# Patient Record
Sex: Male | Born: 1960 | Race: White | Hispanic: No | Marital: Married | Smoking: Current every day smoker
Health system: Southern US, Community
[De-identification: ages and names within clinical notes are randomized; demographics above are authoritative.]

## PROBLEM LIST (undated history)

## (undated) DIAGNOSIS — Z789 Other specified health status: Secondary | ICD-10-CM

## (undated) HISTORY — PX: NO PAST SURGERIES: SHX2092

---

## 2013-02-26 ENCOUNTER — Ambulatory Visit (HOSPITAL_COMMUNITY)
Admission: RE | Admit: 2013-02-26 | Discharge: 2013-02-26 | Disposition: A | Discharge status: Home / Self Care | Payer: Managed Care, Other (non HMO) | Source: Ambulatory Visit | Attending: Internal Medicine | Admitting: Internal Medicine

## 2013-02-26 ENCOUNTER — Other Ambulatory Visit (HOSPITAL_COMMUNITY): Payer: Self-pay | Admitting: Internal Medicine

## 2013-02-26 DIAGNOSIS — M79609 Pain in unspecified limb: Secondary | ICD-10-CM | POA: Insufficient documentation

## 2013-02-26 DIAGNOSIS — M659 Synovitis and tenosynovitis, unspecified: Secondary | ICD-10-CM

## 2013-03-08 ENCOUNTER — Telehealth: Payer: Self-pay

## 2013-03-08 NOTE — Telephone Encounter (Signed)
Pt was referred by Dr. Fusco for screening colonoscopy. LMOM for a return call.  

## 2013-03-09 NOTE — Telephone Encounter (Signed)
PT wants to wait and schedule colonoscopy in Jan 2015. He is on my call list for mid Dec 2014. ( He is not having any problems and no family hx of colon cancer). Letter to Dr. Sherwood Gambler.

## 2013-06-21 ENCOUNTER — Telehealth: Payer: Self-pay

## 2013-06-21 NOTE — Telephone Encounter (Signed)
Pt referred by Dr. Gerarda Fraction for screening colonoscopy. Had wanted to wait until first of the year. I called and left message for him to call to schedule.

## 2013-06-30 NOTE — Telephone Encounter (Signed)
LMOM to call.

## 2013-07-01 ENCOUNTER — Other Ambulatory Visit: Payer: Self-pay

## 2013-07-01 ENCOUNTER — Telehealth: Payer: Self-pay

## 2013-07-01 DIAGNOSIS — Z1211 Encounter for screening for malignant neoplasm of colon: Secondary | ICD-10-CM

## 2013-07-01 MED ORDER — PEG-KCL-NACL-NASULF-NA ASC-C 100 G PO SOLR: 1.0000 | ORAL | Status: DC

## 2013-07-01 NOTE — Telephone Encounter (Signed)
Ok to schedule.

## 2013-07-01 NOTE — Telephone Encounter (Signed)
Gastroenterology Pre-Procedure Review  Request Date: 07/01/2013 Requesting Physician: Dr. Gerarda Fraction  PATIENT REVIEW QUESTIONS: The patient responded to the following health history questions as indicated:    1. Diabetes Melitis: no 2. Joint replacements in the past 12 months: no 3. Major health problems in the past 3 months: no 4. Has an artificial valve or MVP: no 5. Has a defibrillator: no 6. Has been advised in past to take antibiotics in advance of a procedure like teeth cleaning: no    MEDICATIONS & ALLERGIES:    Patient reports the following regarding taking any blood thinners:   Plavix? no Aspirin? no Coumadin? no  Patient confirms/reports the following medications:  Current Outpatient Prescriptions  Medication Sig Dispense Refill  . ibuprofen (ADVIL,MOTRIN) 200 MG tablet Take 200 mg by mouth every 6 (six) hours as needed. Only occasionally       No current facility-administered medications for this visit.    Patient confirms/reports the following allergies:  No Known Allergies  No orders of the defined types were placed in this encounter.    AUTHORIZATION INFORMATION Primary Insurance:   ID #:   Group #:  Pre-Cert / Auth required:  Pre-Cert / Auth #:   Secondary Insurance:   ID #:   Group #:  Pre-Cert / Auth required: Pre-Cert / Auth #:   SCHEDULE INFORMATION: Procedure has been scheduled as follows:  Date:07/26/2013               Time: 8:30 AM Location: [facility] Short Stay  This Gastroenterology Pre-Precedure Review Form is being routed to the following provider(s): R. Garfield Cornea, MD

## 2013-07-01 NOTE — Telephone Encounter (Signed)
I called [employer] at [phone] and got the automation. PA is not required for screening colonoscopy.

## 2013-07-01 NOTE — Telephone Encounter (Signed)
Rx sent to pharmacy and instructions mailed to pt.

## 2013-07-16 ENCOUNTER — Encounter (HOSPITAL_COMMUNITY): Payer: Self-pay | Admitting: Pharmacy Technician

## 2013-07-26 ENCOUNTER — Encounter (HOSPITAL_COMMUNITY): Payer: Self-pay | Admitting: *Deleted

## 2013-07-26 ENCOUNTER — Encounter (HOSPITAL_COMMUNITY)
Admission: RE | Disposition: A | Discharge status: Home / Self Care | Payer: Self-pay | Source: Ambulatory Visit | Attending: Internal Medicine

## 2013-07-26 ENCOUNTER — Ambulatory Visit (HOSPITAL_COMMUNITY)
Admission: RE | Admit: 2013-07-26 | Discharge: 2013-07-26 | Disposition: A | Discharge status: Home / Self Care | Payer: Managed Care, Other (non HMO) | Source: Ambulatory Visit | Attending: Internal Medicine | Admitting: Internal Medicine

## 2013-07-26 DIAGNOSIS — Z1211 Encounter for screening for malignant neoplasm of colon: Secondary | ICD-10-CM | POA: Insufficient documentation

## 2013-07-26 DIAGNOSIS — D126 Benign neoplasm of colon, unspecified: Secondary | ICD-10-CM | POA: Insufficient documentation

## 2013-07-26 DIAGNOSIS — K5730 Diverticulosis of large intestine without perforation or abscess without bleeding: Secondary | ICD-10-CM | POA: Insufficient documentation

## 2013-07-26 HISTORY — DX: Other specified health status: Z78.9

## 2013-07-26 HISTORY — PX: COLONOSCOPY: SHX5424

## 2013-07-26 SURGERY — COLONOSCOPY
Anesthesia: Moderate Sedation

## 2013-07-26 MED ORDER — MEPERIDINE HCL 100 MG/ML IJ SOLN
INTRAMUSCULAR | Status: DC | PRN
  Administered 2013-07-26: 50 mg via INTRAVENOUS
  Administered 2013-07-26: 25 mg via INTRAVENOUS
  Administered 2013-07-26: 50 mg via INTRAVENOUS

## 2013-07-26 MED ORDER — SODIUM CHLORIDE 0.9 % IV SOLN
INTRAVENOUS | Status: DC
Start: 2013-07-26 — End: 2013-07-26
  Administered 2013-07-26: 08:00:00 via INTRAVENOUS

## 2013-07-26 MED ORDER — STERILE WATER FOR IRRIGATION IR SOLN
Status: DC | PRN
  Administered 2013-07-26: 08:00:00

## 2013-07-26 MED ORDER — ONDANSETRON HCL 4 MG/2ML IJ SOLN
INTRAMUSCULAR | Status: AC
  Filled 2013-07-26: qty 2

## 2013-07-26 MED ORDER — MIDAZOLAM HCL 5 MG/5ML IJ SOLN
INTRAMUSCULAR | Status: AC
  Filled 2013-07-26: qty 10

## 2013-07-26 MED ORDER — MIDAZOLAM HCL 5 MG/5ML IJ SOLN
INTRAMUSCULAR | Status: DC | PRN
  Administered 2013-07-26: 2 mg via INTRAVENOUS
  Administered 2013-07-26: 1 mg via INTRAVENOUS
  Administered 2013-07-26: 2 mg via INTRAVENOUS
  Administered 2013-07-26: 1 mg via INTRAVENOUS

## 2013-07-26 MED ORDER — MEPERIDINE HCL 100 MG/ML IJ SOLN
INTRAMUSCULAR | Status: AC
  Filled 2013-07-26: qty 2

## 2013-07-26 NOTE — Discharge Instructions (Addendum)
Colonoscopy Discharge Instructions  Read the instructions outlined below and refer to this sheet in the next few weeks. These discharge instructions provide you with general information on caring for yourself after you leave the hospital. Your doctor may also give you specific instructions. While your treatment has been planned according to the most current medical practices available, unavoidable complications occasionally occur. If you have any problems or questions after discharge, call Dr. Gala Romney at [phone]. ACTIVITY  You may resume your regular activity, but move at a slower pace for the next 24 hours.   Take frequent rest periods for the next 24 hours.   Walking will help get rid of the air and reduce the bloated feeling in your belly (abdomen).   No driving for 24 hours (because of the medicine (anesthesia) used during the test).    Do not sign any important legal documents or operate any machinery for 24 hours (because of the anesthesia used during the test).  NUTRITION  Drink plenty of fluids.   You may resume your normal diet as instructed by your doctor.   Begin with a light meal and progress to your normal diet. Heavy or fried foods are harder to digest and may make you feel sick to your stomach (nauseated).   Avoid alcoholic beverages for 24 hours or as instructed.  MEDICATIONS  You may resume your normal medications unless your doctor tells you otherwise.  WHAT YOU CAN EXPECT TODAY  Some feelings of bloating in the abdomen.   Passage of more gas than usual.   Spotting of blood in your stool or on the toilet paper.  IF YOU HAD POLYPS REMOVED DURING THE COLONOSCOPY:  No aspirin products for 7 days or as instructed.   No alcohol for 7 days or as instructed.   Eat a soft diet for the next 24 hours.  FINDING OUT THE RESULTS OF YOUR TEST Not all test results are available during your visit. If your test results are not back during the visit, make an appointment  with your caregiver to find out the results. Do not assume everything is normal if you have not heard from your caregiver or the medical facility. It is important for you to follow up on all of your test results.  SEEK IMMEDIATE MEDICAL ATTENTION IF:  You have more than a spotting of blood in your stool.   Your belly is swollen (abdominal distention).   You are nauseated or vomiting.   You have a temperature over 101.   You have abdominal pain or discomfort that is severe or gets worse throughout the day.    Diverticulosis and polyp information provided  Further recommendations to follow Diverticulosis Diverticulosis is a common condition that develops when small pouches (diverticula) form in the wall of the colon. The risk of diverticulosis increases with age. It happens more often in people who eat a low-fiber diet. Most individuals with diverticulosis have no symptoms. Those individuals with symptoms usually experience abdominal pain, constipation, or loose stools (diarrhea). HOME CARE INSTRUCTIONS   Increase the amount of fiber in your diet as directed by your caregiver or dietician. This may reduce symptoms of diverticulosis.  Your caregiver may recommend taking a dietary fiber supplement.  Drink at least 6 to 8 glasses of water each day to prevent constipation.  Try not to strain when you have a bowel movement.  Your caregiver may recommend avoiding nuts and seeds to prevent complications, although this is still an uncertain benefit.  Only  take over-the-counter or prescription medicines for pain, discomfort, or fever as directed by your caregiver. FOODS WITH HIGH FIBER CONTENT INCLUDE:  Fruits. Apple, peach, pear, tangerine, raisins, prunes.  Vegetables. Brussels sprouts, asparagus, broccoli, cabbage, carrot, cauliflower, romaine lettuce, spinach, summer squash, tomato, winter squash, zucchini.  Starchy Vegetables. Baked beans, kidney beans, lima beans, split peas,  lentils, potatoes (with skin).  Grains. Whole wheat bread, brown rice, bran flake cereal, plain oatmeal, white rice, shredded wheat, bran muffins. SEEK IMMEDIATE MEDICAL CARE IF:   You develop increasing pain or severe bloating.  You have an oral temperature above 102 F (38.9 C), not controlled by medicine.  You develop vomiting or bowel movements that are bloody or black. Document Released: 02/29/2004 Document Revised: 08/26/2011 Document Reviewed: 11/01/2009 [facility] Patient Information 2014 [place]. Colon Polyps Polyps are lumps of extra tissue growing inside the body. Polyps can grow in the large intestine (colon). Most colon polyps are noncancerous (benign). However, some colon polyps can become cancerous over time. Polyps that are larger than a pea may be harmful. To be safe, caregivers remove and test all polyps. CAUSES  Polyps form when mutations in the genes cause your cells to grow and divide even though no more tissue is needed. RISK FACTORS There are a number of risk factors that can increase your chances of getting colon polyps. They include:  Being older than 50 years.  Family history of colon polyps or colon cancer.  Long-term colon diseases, such as colitis or Crohn disease.  Being overweight.  Smoking.  Being inactive.  Drinking too much alcohol. SYMPTOMS  Most small polyps do not cause symptoms. If symptoms are present, they may include:  Blood in the stool. The stool may look dark red or black.  Constipation or diarrhea that lasts longer than 1 week. DIAGNOSIS People often do not know they have polyps until their caregiver finds them during a regular checkup. Your caregiver can use 4 tests to check for polyps:  Digital rectal exam. The caregiver wears gloves and feels inside the rectum. This test would find polyps only in the rectum.  Barium enema. The caregiver puts a liquid called barium into your rectum before taking X-rays of your colon.  Barium makes your colon look white. Polyps are dark, so they are easy to see in the X-ray pictures.  Sigmoidoscopy. A thin, flexible tube (sigmoidoscope) is placed into your rectum. The sigmoidoscope has a light and tiny camera in it. The caregiver uses the sigmoidoscope to look at the last third of your colon.  Colonoscopy. This test is like sigmoidoscopy, but the caregiver looks at the entire colon. This is the most common method for finding and removing polyps. TREATMENT  Any polyps will be removed during a sigmoidoscopy or colonoscopy. The polyps are then tested for cancer. PREVENTION  To help lower your risk of getting more colon polyps:  Eat plenty of fruits and vegetables. Avoid eating fatty foods.  Do not smoke.  Avoid drinking alcohol.  Exercise every day.  Lose weight if recommended by your caregiver.  Eat plenty of calcium and folate. Foods that are rich in calcium include milk, cheese, and broccoli. Foods that are rich in folate include chickpeas, kidney beans, and spinach. HOME CARE INSTRUCTIONS Keep all follow-up appointments as directed by your caregiver. You may need periodic exams to check for polyps. SEEK MEDICAL CARE IF: You notice bleeding during a bowel movement. Document Released: 02/28/2004 Document Revised: 08/26/2011 Document Reviewed: 08/13/2011 [facility] Patient Information 2014 [place].

## 2013-07-26 NOTE — H&P (Signed)
  Primary Care Physician:  Glo Herring., MD Primary Gastroenterologist:  Dr. Gala Romney  Pre-Procedure History & Physical: HPI:  Antonio Valenzuela is a 53 y.o. male is here for a screening colonoscopy. No bowel symptoms. No prior colonoscopy. No family history colon polyps or colon cancer.  Past Medical History  Diagnosis Date  . Medical history non-contributory     Past Surgical History  Procedure Laterality Date  . No past surgeries      Prior to Admission medications   Medication Sig Start Date End Date Taking? Authorizing Provider  peg 3350 powder (MOVIPREP) 100 G SOLR Take 1 kit (200 g total) by mouth as directed. 07/01/13   Daneil Dolin, MD    Allergies as of 07/01/2013  . (No Known Allergies)    Family History  Problem Relation Age of Onset  . Colon cancer Neg Hx     History   Social History  . Marital Status: Married    Spouse Name: N/A    Number of Children: N/A  . Years of Education: N/A   Occupational History  . Not on file.   Social History Main Topics  . Smoking status: Current Every Day Smoker -- 0.50 packs/day for 20 years    Types: Cigarettes  . Smokeless tobacco: Not on file  . Alcohol Use: Yes     Comment: "very little" per pt   . Drug Use: No  . Sexual Activity: Not on file   Other Topics Concern  . Not on file   Social History Narrative  . No narrative on file    Review of Systems: See HPI, otherwise negative ROS  Physical Exam: BP 145/94  Pulse 66  Temp(Src) 97.5 F (36.4 C) (Oral)  Resp 21  Ht _0  (1.778 m)  Wt 220 lb (99.791 kg)  BMI 31.57 kg/m2  SpO2 97% General:   Alert,  Well-developed, well-nourished, pleasant and cooperative in NAD Head:  Normocephalic and atraumatic. Eyes:  Sclera clear, no icterus.   Conjunctiva pink. Ears:  Normal auditory acuity. Nose:  No deformity, discharge,  or lesions. Mouth:  No deformity or lesions, dentition normal. Neck:  Supple; no masses or thyromegaly. Lungs:  Clear throughout to  auscultation.   No wheezes, crackles, or rhonchi. No acute distress. Heart:  Regular rate and rhythm; no murmurs, clicks, rubs,  or gallops. Abdomen:  Soft, nontender and nondistended. No masses, hepatosplenomegaly or hernias noted. Normal bowel sounds, without guarding, and without rebound.   Msk:  Symmetrical without gross deformities. Normal posture. Pulses:  Normal pulses noted. Extremities:  Without clubbing or edema. Neurologic:  Alert and  oriented x4;  grossly normal neurologically. Skin:  Intact without significant lesions or rashes. Cervical Nodes:  No significant cervical adenopathy. Psych:  Alert and cooperative. Normal mood and affect.  Impression/Plan: Antonio Valenzuela is now here to undergo a screening colonoscopy.   First-ever average risk screening examination.  Risks, benefits, limitations, imponderables and alternatives regarding colonoscopy have been reviewed with the patient. Questions have been answered. All parties agreeable.

## 2013-07-26 NOTE — Op Note (Signed)
[facility] [address] [place], [address]   COLONOSCOPY PROCEDURE REPORT  PATIENT: Antonio Valenzuela, Antonio Valenzuela  MR#:         [id number] BIRTHDATE: 1961-01-22 , 52  yrs. old GENDER: Male ENDOSCOPIST: R.  Garfield Cornea, MD FACP FACG REFERRED BY:  Kerin Perna, M.D. PROCEDURE DATE:  07/26/2013 PROCEDURE:     Ileocolonoscopy with snare polypectomy  INDICATIONS: First-ever average risk colorectal cancer screening examination  INFORMED CONSENT:  The risks, benefits, alternatives and imponderables including but not limited to bleeding, perforation as well as the possibility of a missed lesion have been reviewed.  The potential for biopsy, lesion removal, etc. have also been discussed.  Questions have been answered.  All parties agreeable. Please see the history and physical in the medical record for more information.  MEDICATIONS: Versed 6 mg IV and Demerol 125 mg IV in divided doses. Zofran 4 mg IV .  DESCRIPTION OF PROCEDURE:  After a digital rectal exam was performed, the EC-3890Li ([id number])  colonoscope was advanced from the anus through the rectum and colon to the area of the cecum, ileocecal valve and appendiceal orifice.  The cecum was deeply intubated.  These structures were well-seen and photographed for the record.  From the level of the cecum and ileocecal valve, the scope was slowly and cautiously withdrawn.  The mucosal surfaces were carefully surveyed utilizing scope tip deflection to facilitate fold flattening as needed.  The scope was pulled down into the rectum where a thorough examination including retroflexion was performed.    FINDINGS:  Adequate preparation. Normal rectum. Scattered left-sided diverticula; (1) 6 mm polyp in the mid sigmoid segment; otherwise, the remainder of the colonic mucosa appeared normal. The distal 5 cm of terminal ileal mucosa also appeared normal  THERAPEUTIC / DIAGNOSTIC MANEUVERS PERFORMED:  The above-mentioned polyp was  hot snare removed  COMPLICATIONS: None  CECAL WITHDRAWAL TIME:  9 minutes  IMPRESSION:  Colonic diverticulosis. Colonic polyp-removed as described above  RECOMMENDATIONS: Followup on pathology.   _______________________________ eSigned:  R. Garfield Cornea, MD FACP [facility] 07/26/2013 9:20 AM   CC:

## 2013-07-28 ENCOUNTER — Encounter (HOSPITAL_COMMUNITY): Payer: Self-pay | Admitting: Internal Medicine

## 2013-07-28 ENCOUNTER — Encounter: Payer: Self-pay | Admitting: Internal Medicine

## 2014-05-25 ENCOUNTER — Other Ambulatory Visit (HOSPITAL_COMMUNITY): Payer: Self-pay | Admitting: Physician Assistant

## 2014-05-25 ENCOUNTER — Ambulatory Visit (HOSPITAL_COMMUNITY)
Admission: RE | Admit: 2014-05-25 | Discharge: 2014-05-25 | Disposition: A | Discharge status: Home / Self Care | Payer: Managed Care, Other (non HMO) | Source: Ambulatory Visit | Attending: Physician Assistant | Admitting: Physician Assistant

## 2014-05-25 DIAGNOSIS — M25552 Pain in left hip: Secondary | ICD-10-CM

## 2015-01-05 ENCOUNTER — Emergency Department (INDEPENDENT_AMBULATORY_CARE_PROVIDER_SITE_OTHER): Payer: Managed Care, Other (non HMO)

## 2015-01-05 ENCOUNTER — Encounter (HOSPITAL_COMMUNITY): Payer: Self-pay | Admitting: Emergency Medicine

## 2015-01-05 ENCOUNTER — Emergency Department (HOSPITAL_COMMUNITY)
Admission: EM | Admit: 2015-01-05 | Discharge: 2015-01-05 | Disposition: A | Discharge status: Home / Self Care | Payer: Managed Care, Other (non HMO) | Source: Home / Self Care | Attending: Family Medicine | Admitting: Family Medicine

## 2015-01-05 DIAGNOSIS — S93402A Sprain of unspecified ligament of left ankle, initial encounter: Secondary | ICD-10-CM | POA: Diagnosis not present

## 2015-01-05 NOTE — Discharge Instructions (Signed)

## 2015-01-05 NOTE — ED Provider Notes (Signed)
CSN: [id number]     Arrival date & time 01/05/15  1302 History   First MD Initiated Contact with Patient 01/05/15 1340     Chief Complaint  Patient presents with  . Ankle Injury   (Consider location/radiation/quality/duration/timing/severity/associated sxs/prior Treatment) HPI Comments: 54 year old male states that he stepped in a hole yesterday afternoon with his right foot and "rolled" the ankle. He continued to walk on it and later apply ice and keep it elevated. Today there is swelling primarily to the lateral aspect of the ankle as well as pain with weightbearing.  Patient is a 54 y.o. male presenting with lower extremity injury.  Ankle Injury Pertinent negatives include no headaches.    Past Medical History  Diagnosis Date  . Medical history non-contributory    Past Surgical History  Procedure Laterality Date  . No past surgeries    . Colonoscopy N/A 07/26/2013    Procedure: COLONOSCOPY;  Surgeon: Daneil Dolin, MD;  Location: [facility];  Service: Endoscopy;  Laterality: N/A;  8:30 AM   Family History  Problem Relation Age of Onset  . Colon cancer Neg Hx    History  Substance Use Topics  . Smoking status: Current Every Day Smoker -- 0.50 packs/day for 20 years    Types: Cigarettes  . Smokeless tobacco: Not on file  . Alcohol Use: Yes     Comment: "very little" per pt     Review of Systems  Constitutional: Negative.   Respiratory: Negative.   Musculoskeletal: Positive for joint swelling. Negative for myalgias, back pain and neck pain.       As per HPI  Skin: Negative.   Neurological: Negative for dizziness, weakness, numbness and headaches.    Allergies  Review of patient's allergies indicates no known allergies.  Home Medications   Prior to Admission medications   Not on File   BP 141/91 mmHg  Pulse 80  Temp(Src) 98.5 F (36.9 C) (Oral)  Resp 20  SpO2 97% Physical Exam  Constitutional: He is oriented to person, place, and time. He appears  well-developed and well-nourished.  HENT:  Head: Normocephalic and atraumatic.  Eyes: EOM are normal. Left eye exhibits no discharge.  Neck: Normal range of motion. Neck supple.  Pulmonary/Chest: Effort normal. No respiratory distress.  Musculoskeletal: He exhibits edema and tenderness.  Mild swelling to the lateral aspect of the right ankle. Positive for local tenderness to the lateral malleolus and surrounding soft tissue. No deformity. Minor pallor and ecchymosis to the ankle. Plantarflexion and dorsiflexion is completely intact. Inversion and inversion with partial range of motion due to pain limitations. No tenderness to the foot. No deformity. No swelling to the foot. Pedal pulses 2+. Distal neurovascular motor sensory is intact.   Neurological: He is alert and oriented to person, place, and time. No cranial nerve deficit.  Skin: Skin is warm and dry.  Psychiatric: He has a normal mood and affect.  Nursing note and vitals reviewed.   ED Course  Procedures (including critical care time) Labs Review Labs Reviewed - No data to display  Imaging Review Dg Ankle Complete Right  01/05/2015   CLINICAL DATA:  Stepped in hole yesterday with inversion injury, initial encounter  EXAM: RIGHT ANKLE - COMPLETE 3+ VIEW  COMPARISON:  02/26/2013  FINDINGS: Considerable soft tissue swelling is noted particularly laterally. No acute fracture or dislocation is noted. Well corticated bony densities are noted adjacent to the talus dorsally as well as the base of the fifth metatarsal which  may be related to prior trauma. The density adjacent to the talus is stable from the prior exam.  IMPRESSION: No acute abnormality noted.   Electronically Signed   By: Inez Catalina M.D.   On: 01/05/2015 14:04     MDM   1. Left ankle sprain, initial encounter    ASO RICE Crutches with partial wt bearing as tolerated    Janne Napoleon, NP 01/05/15 1427

## 2015-01-05 NOTE — ED Notes (Signed)
Pt declined crutches... Reports he has some at home... Notified Tiffany L, Rn

## 2015-01-05 NOTE — ED Notes (Signed)
Pt states that he was in his yard yesterday and walked into a hole and rolled his right ankle

## 2016-04-19 IMAGING — DX DG ANKLE COMPLETE 3+V*R*
3 series · 3 of 3 positions shown · non-contrast
Comparison: 02/26/2013

CLINICAL DATA: Stepped in hole yesterday with inversion injury,
initial encounter

EXAM:
RIGHT ANKLE - COMPLETE 3+ VIEW

[ankle ap]
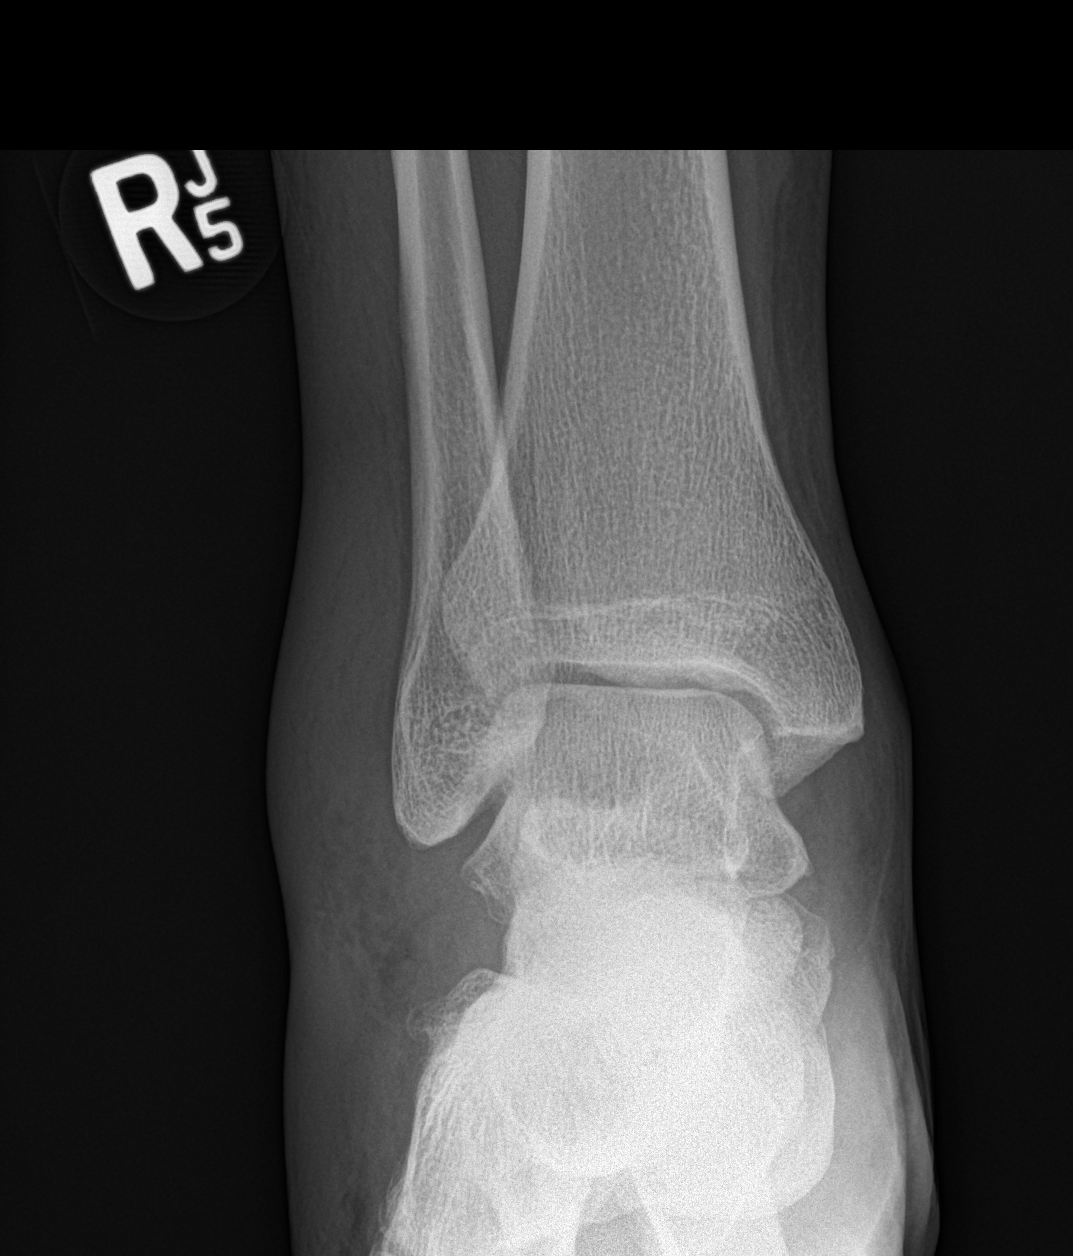

[ankle obl]
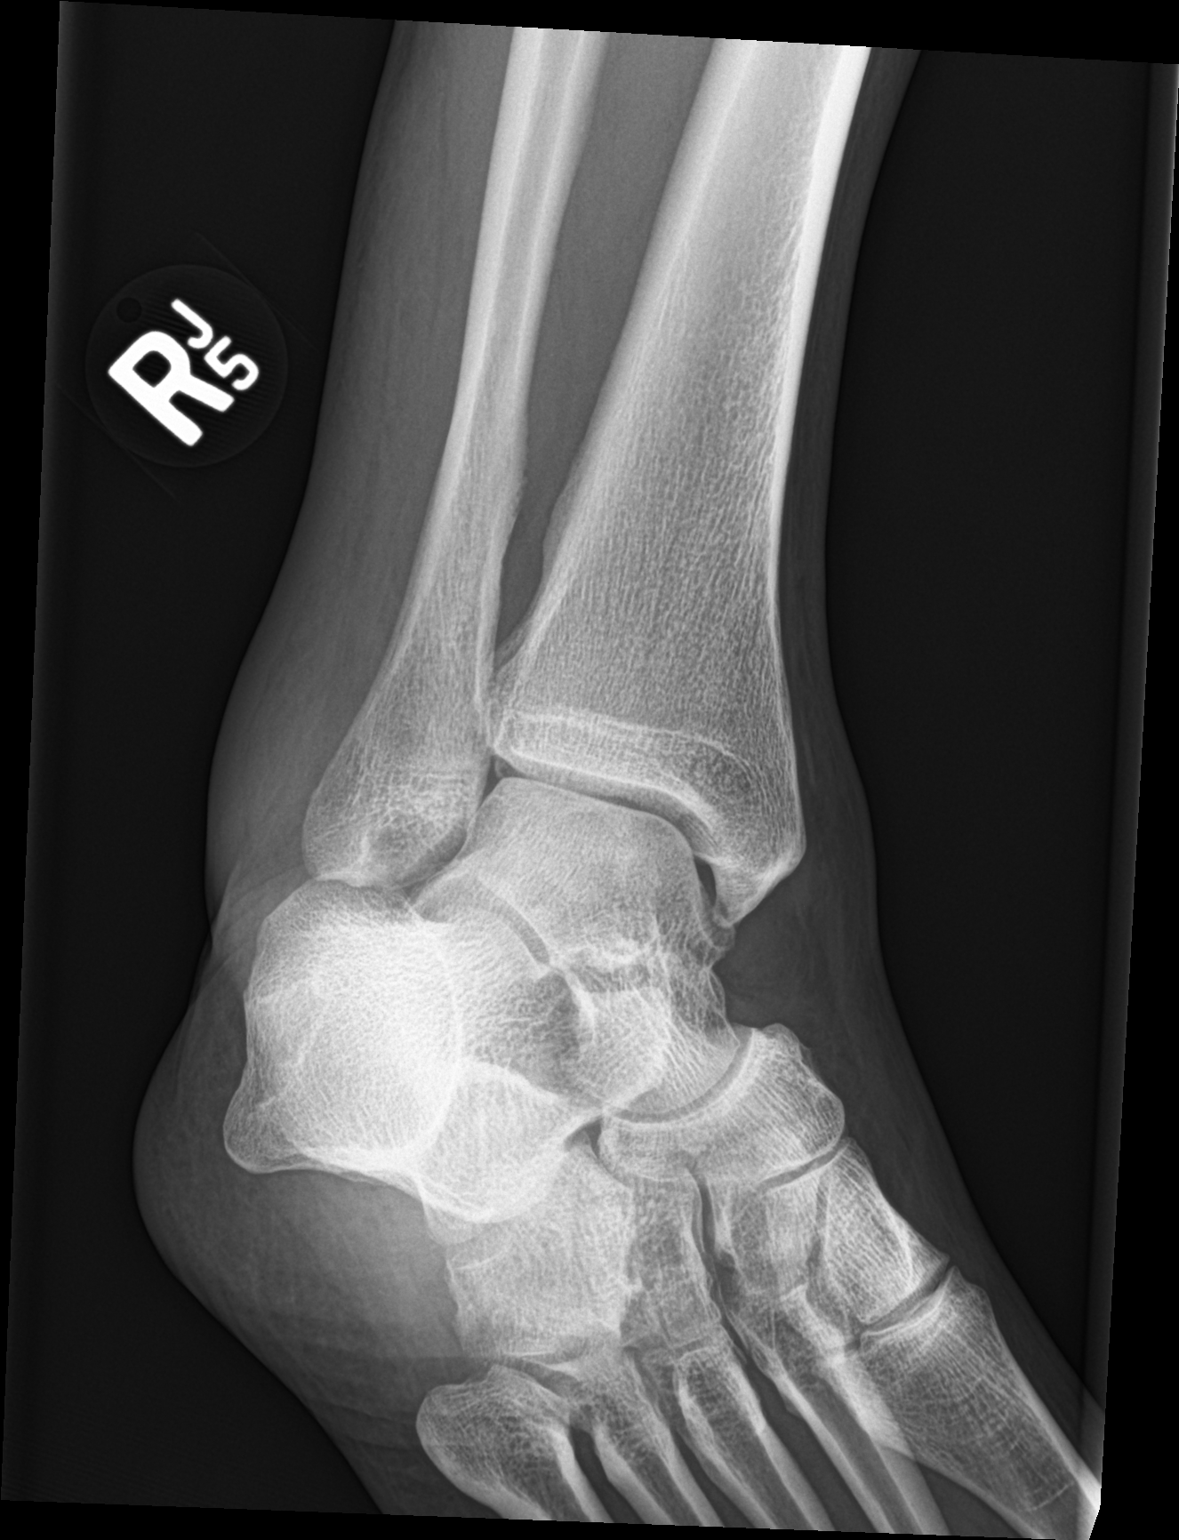

[ankle lat]
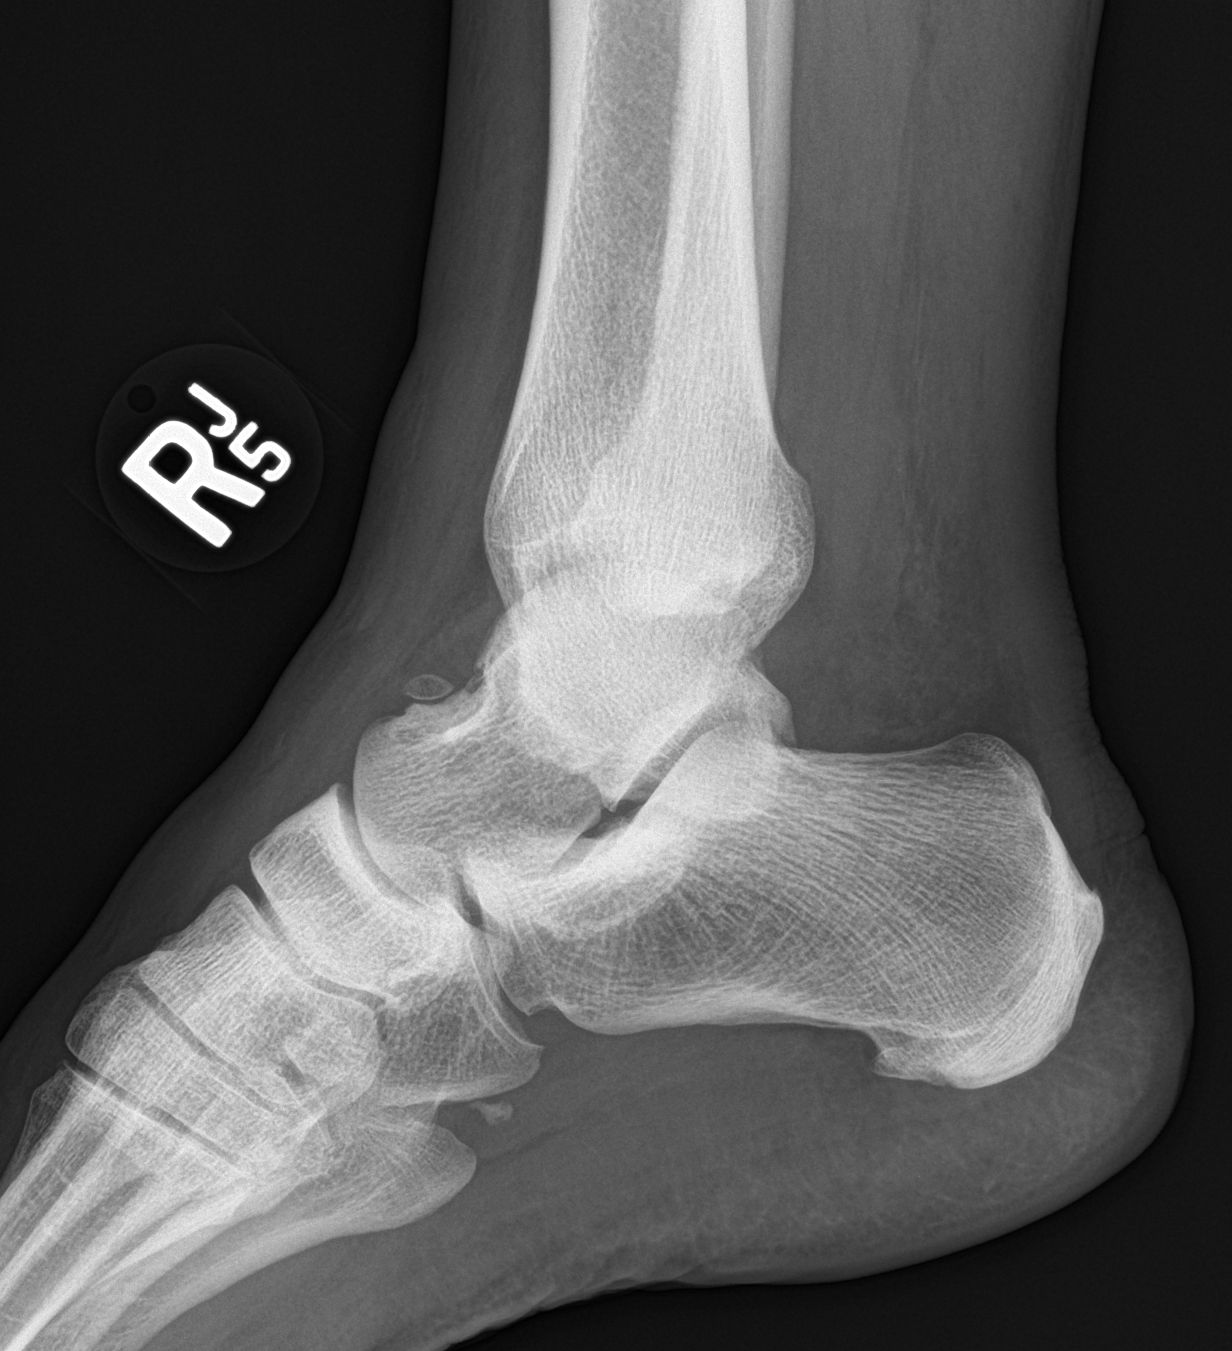

[3 of 3 positions shown; findings below may reference images not displayed]

FINDINGS: Considerable soft tissue swelling is noted particularly laterally.
No acute fracture or dislocation is noted. Well corticated bony
densities are noted adjacent to the talus dorsally as well as the
base of the fifth metatarsal which may be related to prior trauma.
The density adjacent to the talus is stable from the prior exam.
IMPRESSION: No acute abnormality noted.

## 2016-07-18 DIAGNOSIS — F17290 Nicotine dependence, other tobacco product, uncomplicated: Secondary | ICD-10-CM | POA: Diagnosis not present

## 2016-07-18 DIAGNOSIS — R945 Abnormal results of liver function studies: Secondary | ICD-10-CM | POA: Diagnosis not present

## 2016-07-18 DIAGNOSIS — R5383 Other fatigue: Secondary | ICD-10-CM | POA: Diagnosis not present

## 2016-07-18 DIAGNOSIS — Z0001 Encounter for general adult medical examination with abnormal findings: Secondary | ICD-10-CM | POA: Diagnosis not present

## 2016-07-18 DIAGNOSIS — Z1389 Encounter for screening for other disorder: Secondary | ICD-10-CM | POA: Diagnosis not present

## 2016-07-18 DIAGNOSIS — E6609 Other obesity due to excess calories: Secondary | ICD-10-CM | POA: Diagnosis not present

## 2016-07-18 DIAGNOSIS — Z6832 Body mass index (BMI) 32.0-32.9, adult: Secondary | ICD-10-CM | POA: Diagnosis not present

## 2016-07-19 ENCOUNTER — Other Ambulatory Visit (HOSPITAL_COMMUNITY): Payer: Self-pay | Admitting: Internal Medicine

## 2016-07-19 DIAGNOSIS — F17290 Nicotine dependence, other tobacco product, uncomplicated: Secondary | ICD-10-CM

## 2016-07-25 DIAGNOSIS — Z87891 Personal history of nicotine dependence: Secondary | ICD-10-CM | POA: Diagnosis not present

## 2016-07-25 DIAGNOSIS — R05 Cough: Secondary | ICD-10-CM | POA: Diagnosis not present

## 2016-07-30 DIAGNOSIS — Z6832 Body mass index (BMI) 32.0-32.9, adult: Secondary | ICD-10-CM | POA: Diagnosis not present

## 2016-07-30 DIAGNOSIS — R5383 Other fatigue: Secondary | ICD-10-CM | POA: Diagnosis not present

## 2016-08-05 DIAGNOSIS — N281 Cyst of kidney, acquired: Secondary | ICD-10-CM | POA: Diagnosis not present

## 2016-08-29 DIAGNOSIS — G4733 Obstructive sleep apnea (adult) (pediatric): Secondary | ICD-10-CM | POA: Diagnosis not present

## 2016-09-05 DIAGNOSIS — G4733 Obstructive sleep apnea (adult) (pediatric): Secondary | ICD-10-CM | POA: Diagnosis not present

## 2016-10-06 DIAGNOSIS — G4733 Obstructive sleep apnea (adult) (pediatric): Secondary | ICD-10-CM | POA: Diagnosis not present

## 2016-11-05 DIAGNOSIS — G4733 Obstructive sleep apnea (adult) (pediatric): Secondary | ICD-10-CM | POA: Diagnosis not present

## 2016-12-06 DIAGNOSIS — G4733 Obstructive sleep apnea (adult) (pediatric): Secondary | ICD-10-CM | POA: Diagnosis not present

## 2017-01-05 DIAGNOSIS — G4733 Obstructive sleep apnea (adult) (pediatric): Secondary | ICD-10-CM | POA: Diagnosis not present

## 2017-02-05 DIAGNOSIS — G4733 Obstructive sleep apnea (adult) (pediatric): Secondary | ICD-10-CM | POA: Diagnosis not present

## 2017-03-08 DIAGNOSIS — G4733 Obstructive sleep apnea (adult) (pediatric): Secondary | ICD-10-CM | POA: Diagnosis not present

## 2017-04-07 DIAGNOSIS — G4733 Obstructive sleep apnea (adult) (pediatric): Secondary | ICD-10-CM | POA: Diagnosis not present

## 2017-05-08 DIAGNOSIS — G4733 Obstructive sleep apnea (adult) (pediatric): Secondary | ICD-10-CM | POA: Diagnosis not present

## 2017-06-07 DIAGNOSIS — G4733 Obstructive sleep apnea (adult) (pediatric): Secondary | ICD-10-CM | POA: Diagnosis not present

## 2017-07-08 DIAGNOSIS — G4733 Obstructive sleep apnea (adult) (pediatric): Secondary | ICD-10-CM | POA: Diagnosis not present

## 2017-07-16 DIAGNOSIS — Z6834 Body mass index (BMI) 34.0-34.9, adult: Secondary | ICD-10-CM | POA: Diagnosis not present

## 2017-07-16 DIAGNOSIS — B349 Viral infection, unspecified: Secondary | ICD-10-CM | POA: Diagnosis not present

## 2017-07-16 DIAGNOSIS — R509 Fever, unspecified: Secondary | ICD-10-CM | POA: Diagnosis not present

## 2017-07-16 DIAGNOSIS — R51 Headache: Secondary | ICD-10-CM | POA: Diagnosis not present

## 2017-07-16 DIAGNOSIS — R05 Cough: Secondary | ICD-10-CM | POA: Diagnosis not present

## 2017-07-16 DIAGNOSIS — Z1389 Encounter for screening for other disorder: Secondary | ICD-10-CM | POA: Diagnosis not present

## 2017-08-08 DIAGNOSIS — G4733 Obstructive sleep apnea (adult) (pediatric): Secondary | ICD-10-CM | POA: Diagnosis not present

## 2017-08-14 DIAGNOSIS — E6609 Other obesity due to excess calories: Secondary | ICD-10-CM | POA: Diagnosis not present

## 2017-08-14 DIAGNOSIS — Z6833 Body mass index (BMI) 33.0-33.9, adult: Secondary | ICD-10-CM | POA: Diagnosis not present

## 2017-08-14 DIAGNOSIS — M25552 Pain in left hip: Secondary | ICD-10-CM | POA: Diagnosis not present

## 2017-09-05 DIAGNOSIS — G4733 Obstructive sleep apnea (adult) (pediatric): Secondary | ICD-10-CM | POA: Diagnosis not present

## 2017-10-06 DIAGNOSIS — G4733 Obstructive sleep apnea (adult) (pediatric): Secondary | ICD-10-CM | POA: Diagnosis not present

## 2018-01-14 DIAGNOSIS — M722 Plantar fascial fibromatosis: Secondary | ICD-10-CM | POA: Diagnosis not present

## 2018-01-14 DIAGNOSIS — L851 Acquired keratosis [keratoderma] palmaris et plantaris: Secondary | ICD-10-CM | POA: Diagnosis not present

## 2018-01-14 DIAGNOSIS — M79672 Pain in left foot: Secondary | ICD-10-CM | POA: Diagnosis not present

## 2018-03-03 DIAGNOSIS — Z0001 Encounter for general adult medical examination with abnormal findings: Secondary | ICD-10-CM | POA: Diagnosis not present

## 2018-04-07 DIAGNOSIS — G4733 Obstructive sleep apnea (adult) (pediatric): Secondary | ICD-10-CM | POA: Diagnosis not present

## 2018-05-27 DIAGNOSIS — Z6829 Body mass index (BMI) 29.0-29.9, adult: Secondary | ICD-10-CM | POA: Diagnosis not present

## 2018-05-27 DIAGNOSIS — E663 Overweight: Secondary | ICD-10-CM | POA: Diagnosis not present

## 2018-05-27 DIAGNOSIS — H6993 Unspecified Eustachian tube disorder, bilateral: Secondary | ICD-10-CM | POA: Diagnosis not present

## 2018-05-27 DIAGNOSIS — J019 Acute sinusitis, unspecified: Secondary | ICD-10-CM | POA: Diagnosis not present

## 2018-11-26 DIAGNOSIS — G4733 Obstructive sleep apnea (adult) (pediatric): Secondary | ICD-10-CM | POA: Diagnosis not present

## 2019-06-17 ENCOUNTER — Other Ambulatory Visit: Payer: Self-pay

## 2019-06-17 ENCOUNTER — Ambulatory Visit
Admission: EM | Admit: 2019-06-17 | Discharge: 2019-06-17 | Disposition: A | Discharge status: Home / Self Care | Payer: BC Managed Care – PPO | Attending: Emergency Medicine | Admitting: Emergency Medicine

## 2019-06-17 DIAGNOSIS — Z20828 Contact with and (suspected) exposure to other viral communicable diseases: Secondary | ICD-10-CM | POA: Diagnosis not present

## 2019-06-17 DIAGNOSIS — Z20822 Contact with and (suspected) exposure to covid-19: Secondary | ICD-10-CM

## 2019-06-17 NOTE — ED Provider Notes (Signed)
[place]   [id number]:[id number] 06/17/19 Arrival Time: 63   CC: COVID exposure  SUBJECTIVE: History from: patient.  Antonio Valenzuela is a 58 y.o. male who presents for COVID testing.  COVID exposure 06/11/2019.  Denies recent travel.  Denies aggravating or alleviating symptoms.  Denies previous COVID infection.   Denies fever, chills, fatigue, nasal congestion, rhinorrhea, sore throat, cough, SOB, wheezing, chest pain, nausea, vomiting, changes in bowel or bladder habits.     ROS: As per HPI.  All other pertinent ROS negative.     Past Medical History:  Diagnosis Date  . Medical history non-contributory    Past Surgical History:  Procedure Laterality Date  . COLONOSCOPY N/A 07/26/2013   Procedure: COLONOSCOPY;  Surgeon: Daneil Dolin, MD;  Location: [facility];  Service: Endoscopy;  Laterality: N/A;  8:30 AM  . NO PAST SURGERIES     No Known Allergies No current facility-administered medications on file prior to encounter.   No current outpatient medications on file prior to encounter.   Social History   Socioeconomic History  . Marital status: Married    Spouse name: Not on file  . Number of children: Not on file  . Years of education: Not on file  . Highest education level: Not on file  Occupational History  . Not on file  Tobacco Use  . Smoking status: Current Every Day Smoker    Packs/day: 0.50    Years: 20.00    Pack years: 10.00    Types: Cigarettes  Substance and Sexual Activity  . Alcohol use: Yes    Comment: "very little" per pt   . Drug use: No  . Sexual activity: Not on file  Other Topics Concern  . Not on file  Social History Narrative  . Not on file   Social Determinants of Health   Financial Resource Strain:   . Difficulty of Paying Living Expenses: Not on file  Food Insecurity:   . Worried About Charity fundraiser in the Last Year: Not on file  . Ran Out of Food in the Last Year: Not on file  Transportation Needs:   . Lack of  Transportation (Medical): Not on file  . Lack of Transportation (Non-Medical): Not on file  Physical Activity:   . Days of Exercise per Week: Not on file  . Minutes of Exercise per Session: Not on file  Stress:   . Feeling of Stress : Not on file  Social Connections:   . Frequency of Communication with Friends and Family: Not on file  . Frequency of Social Gatherings with Friends and Family: Not on file  . Attends Religious Services: Not on file  . Active Member of Clubs or Organizations: Not on file  . Attends Archivist Meetings: Not on file  . Marital Status: Not on file  Intimate Partner Violence:   . Fear of Current or Ex-Partner: Not on file  . Emotionally Abused: Not on file  . Physically Abused: Not on file  . Sexually Abused: Not on file   Family History  Problem Relation Age of Onset  . Pulmonary disease Father   . Colon cancer Neg Hx     OBJECTIVE:  Vitals:   06/17/19 1357  BP: 119/83  Pulse: 74  Resp: 18  Temp: 98.5 F (36.9 C)  SpO2: 94%     General appearance: alert; well-appearing nontoxic; speaking in full sentences and tolerating own secretions HEENT: NCAT; Ears: EACs clear, TMs pearly  gray; Eyes: PERRL.  EOM grossly intact. Nose: nares patent without rhinorrhea, Throat: oropharynx clear, tonsils non erythematous or enlarged, uvula midline  Neck: supple without LAD Lungs: unlabored respirations, symmetrical air entry; cough: absent; no respiratory distress; CTAB Heart: regular rate and rhythm.  Skin: warm and dry Psychological: alert and cooperative; normal mood and affect  ASSESSMENT & PLAN:  1. Exposure to COVID-19 virus    COVID testing ordered.  It will take between 5-7 days for test results.  Someone will contact you regarding abnormal results.    In the meantime: You should remain isolated in your home for 10 days from symptom onset AND greater than 72 hours after symptoms resolution (absence of fever without the use of  fever-reducing medication and improvement in respiratory symptoms), whichever is longer OR 14 days from exposure Get plenty of rest and push fluids Use OTC zyrtec for nasal congestion, runny nose, and/or sore throat Use OTC flonase for nasal congestion and runny nose Use medications daily for symptom relief Use OTC medications like ibuprofen or tylenol as needed fever or pain Call or go to the ED if you have any new or worsening symptoms such as fever, cough, shortness of breath, chest tightness, chest pain, turning blue, changes in mental status, etc...   Reviewed expectations re: course of current medical issues. Questions answered. Outlined signs and symptoms indicating need for more acute intervention. Patient verbalized understanding. After Visit Summary given.         Lestine Box, PA-C 06/17/19 1436

## 2019-06-17 NOTE — ED Triage Notes (Signed)
Pt had positive exposure and needs covid test

## 2019-06-17 NOTE — Discharge Instructions (Signed)

## 2019-06-19 LAB — NOVEL CORONAVIRUS, NAA: SARS-CoV-2, NAA: NOT DETECTED

## 2019-06-22 ENCOUNTER — Other Ambulatory Visit: Payer: Self-pay

## 2019-06-22 ENCOUNTER — Ambulatory Visit: Payer: BC Managed Care – PPO | Attending: Internal Medicine

## 2019-06-22 DIAGNOSIS — Z20822 Contact with and (suspected) exposure to covid-19: Secondary | ICD-10-CM

## 2019-06-24 LAB — NOVEL CORONAVIRUS, NAA: SARS-CoV-2, NAA: NOT DETECTED

## 2019-08-16 DIAGNOSIS — G4733 Obstructive sleep apnea (adult) (pediatric): Secondary | ICD-10-CM | POA: Diagnosis not present

## 2019-08-26 DIAGNOSIS — Z23 Encounter for immunization: Secondary | ICD-10-CM | POA: Diagnosis not present

## 2019-09-25 DIAGNOSIS — Z23 Encounter for immunization: Secondary | ICD-10-CM | POA: Diagnosis not present

## 2019-10-05 DIAGNOSIS — Z1389 Encounter for screening for other disorder: Secondary | ICD-10-CM | POA: Diagnosis not present

## 2019-10-05 DIAGNOSIS — D179 Benign lipomatous neoplasm, unspecified: Secondary | ICD-10-CM | POA: Diagnosis not present

## 2019-10-05 DIAGNOSIS — M6200 Separation of muscle (nontraumatic), unspecified site: Secondary | ICD-10-CM | POA: Diagnosis not present

## 2019-10-05 DIAGNOSIS — E663 Overweight: Secondary | ICD-10-CM | POA: Diagnosis not present

## 2019-10-05 DIAGNOSIS — Z0001 Encounter for general adult medical examination with abnormal findings: Secondary | ICD-10-CM | POA: Diagnosis not present

## 2019-10-05 DIAGNOSIS — Z6829 Body mass index (BMI) 29.0-29.9, adult: Secondary | ICD-10-CM | POA: Diagnosis not present

## 2020-03-21 DIAGNOSIS — G4733 Obstructive sleep apnea (adult) (pediatric): Secondary | ICD-10-CM | POA: Diagnosis not present

## 2020-04-03 DIAGNOSIS — G4733 Obstructive sleep apnea (adult) (pediatric): Secondary | ICD-10-CM | POA: Diagnosis not present

## 2020-06-22 ENCOUNTER — Ambulatory Visit: Payer: Self-pay | Attending: Internal Medicine

## 2020-06-22 DIAGNOSIS — Z23 Encounter for immunization: Secondary | ICD-10-CM

## 2020-06-22 NOTE — Progress Notes (Signed)
   Covid-19 Vaccination Clinic  Name:  Antonio Valenzuela    MRN: [id number] DOB: 1961/05/28  06/22/2020  [name] was observed post Covid-19 immunization for 15 minutes without incident. He was provided with Vaccine Information Sheet and instruction to access the V-Safe system.   [name] was instructed to call 911 with any severe reactions post vaccine: Marland Kitchen Difficulty breathing  . Swelling of face and throat  . A fast heartbeat  . A bad rash all over body  . Dizziness and weakness   Immunizations Administered    Name Date Dose VIS Date Route   Moderna Covid-19 Booster Vaccine 06/22/2020  2:48 PM 0.25 mL 04/05/2020 Intramuscular   Manufacturer: Gala Murdoch   Lot: [id number]   NDC: 55732-202-54

## 2020-07-20 ENCOUNTER — Encounter: Payer: Self-pay | Admitting: Internal Medicine

## 2020-07-20 ENCOUNTER — Other Ambulatory Visit: Payer: Self-pay

## 2020-07-20 DIAGNOSIS — Z20822 Contact with and (suspected) exposure to covid-19: Secondary | ICD-10-CM

## 2020-07-22 LAB — SARS-COV-2, NAA 2 DAY TAT

## 2020-07-22 LAB — SPECIMEN STATUS REPORT

## 2020-07-22 LAB — NOVEL CORONAVIRUS, NAA

## 2021-01-10 DIAGNOSIS — G4733 Obstructive sleep apnea (adult) (pediatric): Secondary | ICD-10-CM | POA: Diagnosis not present

## 2021-02-14 DIAGNOSIS — Z1211 Encounter for screening for malignant neoplasm of colon: Secondary | ICD-10-CM | POA: Diagnosis not present

## 2021-02-14 DIAGNOSIS — R0689 Other abnormalities of breathing: Secondary | ICD-10-CM | POA: Diagnosis not present

## 2021-04-09 DIAGNOSIS — D124 Benign neoplasm of descending colon: Secondary | ICD-10-CM | POA: Diagnosis not present

## 2021-04-09 DIAGNOSIS — K621 Rectal polyp: Secondary | ICD-10-CM | POA: Diagnosis not present

## 2021-04-09 DIAGNOSIS — Z1211 Encounter for screening for malignant neoplasm of colon: Secondary | ICD-10-CM | POA: Diagnosis not present

## 2021-04-09 DIAGNOSIS — K5730 Diverticulosis of large intestine without perforation or abscess without bleeding: Secondary | ICD-10-CM | POA: Diagnosis not present

## 2021-04-09 DIAGNOSIS — K635 Polyp of colon: Secondary | ICD-10-CM | POA: Diagnosis not present

## 2021-04-21 ENCOUNTER — Ambulatory Visit
Admission: EM | Admit: 2021-04-21 | Discharge: 2021-04-21 | Disposition: A | Discharge status: Home / Self Care | Payer: BC Managed Care – PPO | Attending: Family Medicine | Admitting: Family Medicine

## 2021-04-21 ENCOUNTER — Encounter: Payer: Self-pay | Admitting: Emergency Medicine

## 2021-04-21 ENCOUNTER — Other Ambulatory Visit: Payer: Self-pay

## 2021-04-21 DIAGNOSIS — H6983 Other specified disorders of Eustachian tube, bilateral: Secondary | ICD-10-CM

## 2021-04-21 DIAGNOSIS — H9203 Otalgia, bilateral: Secondary | ICD-10-CM

## 2021-04-21 MED ORDER — PREDNISONE 20 MG PO TABS
40.0000 mg | ORAL_TABLET | Freq: Every day | ORAL | 0 refills | Status: AC
Start: 2021-04-21 — End: ?

## 2021-04-21 NOTE — ED Triage Notes (Signed)
Pt is present today with bilateral ear ache. Pt states sx started x2 weeks ago

## 2021-04-23 NOTE — ED Provider Notes (Signed)
  Antonio Valenzuela   [id number] 04/21/21 Arrival Time: 0800  ASSESSMENT & PLAN:  1. Acute otalgia, bilateral   2. Eustachian tube dysfunction, bilateral    OTC symptom care as needed. No signs of ear infection.  Trial of: Meds ordered this encounter  Medications   predniSONE (DELTASONE) 20 MG tablet    Sig: Take 2 tablets (40 mg total) by mouth daily.    Dispense:  10 tablet    Refill:  0   Recommend:  Follow-up Information     Redmond School, MD.   Specialty: Internal Medicine Why: If worsening or failing to improve as anticipated. Contact information: [address] [place] [address] [phone]                 Reviewed expectations re: course of current medical issues. Questions answered. Outlined signs and symptoms indicating need for more acute intervention. Understanding verbalized. After Visit Summary given.   SUBJECTIVE: History from: patient. Antonio Valenzuela is a 60 y.o. male who reports: bilat otalgia; pressure feeling; without drainage or bleeding; past 2 weeks; h/o similar in the past. Denies: fever. Normal PO intake without n/v/d. No tx PTA.  OBJECTIVE:  Vitals:   04/21/21 0809  BP: (!) 145/87  Pulse: 76  Resp: 18  Temp: 98 F (36.7 C)  SpO2: 96%    General appearance: alert; no distress Eyes: PERRLA; EOMI; conjunctiva normal HENT: [place]; AT; with mild nasal congestion; serous otitis bilat; no signs of infection Neck: supple  Lungs: speaks full sentences without difficulty; unlabored Extremities: no edema Skin: warm and dry Neurologic: normal gait Psychological: alert and cooperative; normal mood and affect    No Known Allergies  Past Medical History:  Diagnosis Date   Medical history non-contributory    Social History   Socioeconomic History   Marital status: Married    Spouse name: Not on file   Number of children: Not on file   Years of education: Not on file   Highest education level: Not on file   Occupational History   Not on file  Tobacco Use   Smoking status: Every Day    Packs/day: 0.50    Years: 20.00    Pack years: 10.00    Types: Cigarettes   Smokeless tobacco: Not on file  Substance and Sexual Activity   Alcohol use: Yes    Comment: "very little" per pt    Drug use: No   Sexual activity: Not on file  Other Topics Concern   Not on file  Social History Narrative   Not on file   Social Determinants of Health   Financial Resource Strain: Not on file  Food Insecurity: Not on file  Transportation Needs: Not on file  Physical Activity: Not on file  Stress: Not on file  Social Connections: Not on file  Intimate Partner Violence: Not on file   Family History  Problem Relation Age of Onset   Pulmonary disease Father    Colon cancer Neg Hx    Past Surgical History:  Procedure Laterality Date   COLONOSCOPY N/A 07/26/2013   Procedure: COLONOSCOPY;  Surgeon: Daneil Dolin, MD;  [facility]: [facility];  Service: Endoscopy;  Laterality: N/A;  8:30 AM   NO PAST SURGERIES       Vanessa Kick, MD 04/23/21 [phone]

## 2021-08-23 DIAGNOSIS — G4733 Obstructive sleep apnea (adult) (pediatric): Secondary | ICD-10-CM | POA: Diagnosis not present

## 2021-08-23 DIAGNOSIS — Z6830 Body mass index (BMI) 30.0-30.9, adult: Secondary | ICD-10-CM | POA: Diagnosis not present

## 2021-08-23 DIAGNOSIS — Z9989 Dependence on other enabling machines and devices: Secondary | ICD-10-CM | POA: Diagnosis not present

## 2021-08-23 DIAGNOSIS — E6609 Other obesity due to excess calories: Secondary | ICD-10-CM | POA: Diagnosis not present

## 2022-04-30 DIAGNOSIS — G4733 Obstructive sleep apnea (adult) (pediatric): Secondary | ICD-10-CM | POA: Diagnosis not present

## 2022-04-30 DIAGNOSIS — Z6831 Body mass index (BMI) 31.0-31.9, adult: Secondary | ICD-10-CM | POA: Diagnosis not present

## 2022-04-30 DIAGNOSIS — Z125 Encounter for screening for malignant neoplasm of prostate: Secondary | ICD-10-CM | POA: Diagnosis not present

## 2022-04-30 DIAGNOSIS — E6609 Other obesity due to excess calories: Secondary | ICD-10-CM | POA: Diagnosis not present

## 2022-04-30 DIAGNOSIS — R5383 Other fatigue: Secondary | ICD-10-CM | POA: Diagnosis not present

## 2022-04-30 DIAGNOSIS — Z9989 Dependence on other enabling machines and devices: Secondary | ICD-10-CM | POA: Diagnosis not present

## 2022-04-30 DIAGNOSIS — Z1331 Encounter for screening for depression: Secondary | ICD-10-CM | POA: Diagnosis not present

## 2022-04-30 DIAGNOSIS — Z0001 Encounter for general adult medical examination with abnormal findings: Secondary | ICD-10-CM | POA: Diagnosis not present

## 2022-06-06 DIAGNOSIS — E2749 Other adrenocortical insufficiency: Secondary | ICD-10-CM | POA: Diagnosis not present

## 2022-06-12 DIAGNOSIS — G4733 Obstructive sleep apnea (adult) (pediatric): Secondary | ICD-10-CM | POA: Diagnosis not present

## 2022-06-29 ENCOUNTER — Ambulatory Visit
Admission: EM | Admit: 2022-06-29 | Discharge: 2022-06-29 | Disposition: A | Discharge status: Home / Self Care | Payer: BC Managed Care – PPO | Attending: Family Medicine | Admitting: Family Medicine

## 2022-06-29 DIAGNOSIS — U071 COVID-19: Secondary | ICD-10-CM | POA: Insufficient documentation

## 2022-06-29 DIAGNOSIS — R051 Acute cough: Secondary | ICD-10-CM | POA: Insufficient documentation

## 2022-06-29 MED ORDER — PAXLOVID (300/100) 20 X 150 MG & 10 X 100MG PO TBPK: 3.0000 | ORAL_TABLET | Freq: Two times a day (BID) | ORAL | 0 refills | Status: AC

## 2022-06-29 NOTE — ED Provider Notes (Signed)
[facility]    CSN: [id number] Arrival date & time: 06/29/22  9417      History   Chief Complaint Chief Complaint  Patient presents with   Cough   Nasal Congestion    HPI Antonio Valenzuela is a 62 y.o. male.   Presenting today with 1 day history of cough, congestion, chills, body aches.  Denies chest pain, shortness of breath, abdominal pain, nausea vomiting or diarrhea.  Wife tested positive for COVID and he had a positive home COVID test this morning.  Take ibuprofen with mild temporary relief.    Past Medical History:  Diagnosis Date   Medical history non-contributory     There are no problems to display for this patient.   Past Surgical History:  Procedure Laterality Date   COLONOSCOPY N/A 07/26/2013   Procedure: COLONOSCOPY;  Surgeon: Daneil Dolin, MD;  [facility]: [facility];  Service: Endoscopy;  Laterality: N/A;  8:30 AM   NO PAST SURGERIES         Home Medications    Prior to Admission medications   Medication Sig Start Date End Date Taking? Authorizing Provider  nirmatrelvir & ritonavir (PAXLOVID, 300/100,) 20 x 150 MG & 10 x '100MG'$  TBPK Take 3 tablets by mouth 2 (two) times daily for 5 days. 06/29/22 07/04/22 Yes Volney American, PA-C  predniSONE (DELTASONE) 20 MG tablet Take 2 tablets (40 mg total) by mouth daily. 04/21/21   Vanessa Kick, MD    Family History Family History  Problem Relation Age of Onset   Pulmonary disease Father    Colon cancer Neg Hx     Social History Social History   Tobacco Use   Smoking status: Every Day    Packs/day: 0.50    Years: 20.00    Total pack years: 10.00    Types: Cigarettes  Substance Use Topics   Alcohol use: Yes    Comment: "very little" per pt    Drug use: No     Allergies   Patient has no known allergies.   Review of Systems Review of Systems Per HPI  Physical Exam Triage Vital Signs ED Triage Vitals  Enc Vitals Group     BP 06/29/22 0938 (!) 155/88     Pulse Rate  06/29/22 0938 85     Resp 06/29/22 0938 16     Temp 06/29/22 0938 99.9 F (37.7 C)     Temp Source 06/29/22 0938 Oral     SpO2 06/29/22 0938 95 %     Weight --      Height --      Head Circumference --      Peak Flow --      Pain Score 06/29/22 0939 2     Pain Loc --      Pain Edu? --      Excl. in [place]? --    No data found.  Updated Vital Signs BP (!) 155/88 (BP Location: Right Arm)   Pulse 85   Temp 99.9 F (37.7 C) (Oral)   Resp 16   SpO2 95%   Visual Acuity Right Eye Distance:   Left Eye Distance:   Bilateral Distance:    Right Eye Near:   Left Eye Near:    Bilateral Near:     Physical Exam Vitals and nursing note reviewed.  Constitutional:      Appearance: He is well-developed.  HENT:     Head: Atraumatic.     Right Ear:  External ear normal.     Left Ear: External ear normal.     Nose: Nose normal.     Mouth/Throat:     Pharynx: Posterior oropharyngeal erythema present. No oropharyngeal exudate.  Eyes:     Conjunctiva/sclera: Conjunctivae normal.     Pupils: Pupils are equal, round, and reactive to light.  Cardiovascular:     Rate and Rhythm: Normal rate and regular rhythm.  Pulmonary:     Effort: Pulmonary effort is normal. No respiratory distress.     Breath sounds: No wheezing or rales.  Musculoskeletal:        General: Normal range of motion.     Cervical back: Normal range of motion and neck supple.  Lymphadenopathy:     Cervical: No cervical adenopathy.  Skin:    General: Skin is warm and dry.  Neurological:     Mental Status: He is alert and oriented to person, place, and time.  Psychiatric:        Behavior: Behavior normal.      UC Treatments / Results  Labs (all labs ordered are listed, but only abnormal results are displayed) Labs Reviewed  SARS CORONAVIRUS 2 (TAT 6-24 HRS)    EKG   Radiology No results found.  Procedures Procedures (including critical care time)  Medications Ordered in UC Medications - No data to  display  Initial Impression / Assessment and Plan / UC Course  I have reviewed the triage vital signs and the nursing notes.  Pertinent labs & imaging results that were available during my care of the patient were reviewed by me and considered in my medical decision making (see chart for details).     Home COVID test positive, retest per his request for confirmation but will start Paxlovid and discussed supportive over-the-counter medications and home care.  No kidney function testing on file but states he just had his physical 1 month ago with his PCP who is out of system for [place] to see and that his BMP was normal.  Final Clinical Impressions(s) / UC Diagnoses   Final diagnoses:  COVID-19   Discharge Instructions   None    ED Prescriptions     Medication Sig Dispense Auth. Provider   nirmatrelvir & ritonavir (PAXLOVID, 300/100,) 20 x 150 MG & 10 x '100MG'$  TBPK Take 3 tablets by mouth 2 (two) times daily for 5 days. 30 tablet Volney American, [place]      PDMP not reviewed this encounter.   Volney American, [place] 06/29/22 1026

## 2022-06-29 NOTE — ED Triage Notes (Signed)
Cough, congestion, cold chills, that started yesterday.  Positive home Covid test today. Taking ibuprofen.

## 2022-06-30 LAB — SARS CORONAVIRUS 2 (TAT 6-24 HRS): SARS Coronavirus 2: POSITIVE — AB

## 2022-10-15 DIAGNOSIS — B078 Other viral warts: Secondary | ICD-10-CM | POA: Diagnosis not present

## 2022-10-15 DIAGNOSIS — L820 Inflamed seborrheic keratosis: Secondary | ICD-10-CM | POA: Diagnosis not present

## 2023-04-22 DIAGNOSIS — G4733 Obstructive sleep apnea (adult) (pediatric): Secondary | ICD-10-CM | POA: Diagnosis not present

## 2023-09-10 DIAGNOSIS — M25531 Pain in right wrist: Secondary | ICD-10-CM | POA: Diagnosis not present

## 2023-09-10 DIAGNOSIS — M25532 Pain in left wrist: Secondary | ICD-10-CM | POA: Diagnosis not present

## 2023-09-10 DIAGNOSIS — M1811 Unilateral primary osteoarthritis of first carpometacarpal joint, right hand: Secondary | ICD-10-CM | POA: Diagnosis not present

## 2023-09-10 DIAGNOSIS — M13831 Other specified arthritis, right wrist: Secondary | ICD-10-CM | POA: Diagnosis not present

## 2024-02-03 DIAGNOSIS — G4733 Obstructive sleep apnea (adult) (pediatric): Secondary | ICD-10-CM | POA: Diagnosis not present

## 2024-05-31 ENCOUNTER — Ambulatory Visit: Payer: Self-pay

## 2024-05-31 ENCOUNTER — Other Ambulatory Visit: Payer: Self-pay

## 2024-05-31 VITALS — BP 120/82 | HR 77 | Ht 70.0 in | Wt 231.1 lb

## 2024-05-31 DIAGNOSIS — Z6833 Body mass index (BMI) 33.0-33.9, adult: Secondary | ICD-10-CM | POA: Diagnosis not present

## 2024-05-31 DIAGNOSIS — E66811 Obesity, class 1: Secondary | ICD-10-CM

## 2024-05-31 DIAGNOSIS — R030 Elevated blood-pressure reading, without diagnosis of hypertension: Secondary | ICD-10-CM | POA: Diagnosis not present

## 2024-05-31 DIAGNOSIS — G4733 Obstructive sleep apnea (adult) (pediatric): Secondary | ICD-10-CM

## 2024-05-31 DIAGNOSIS — F17290 Nicotine dependence, other tobacco product, uncomplicated: Secondary | ICD-10-CM | POA: Diagnosis not present

## 2024-05-31 DIAGNOSIS — G4730 Sleep apnea, unspecified: Secondary | ICD-10-CM

## 2024-05-31 DIAGNOSIS — Z0000 Encounter for general adult medical examination without abnormal findings: Secondary | ICD-10-CM

## 2024-05-31 DIAGNOSIS — Z125 Encounter for screening for malignant neoplasm of prostate: Secondary | ICD-10-CM

## 2024-05-31 NOTE — Progress Notes (Unsigned)
 New Patient Office Visit  Subjective    Patient ID: Antonio Valenzuela, male    DOB: 12-Dec-1960  Age: 63 y.o. MRN: [id number]  CC:  Chief Complaint  Patient presents with   Establish Care    Here to establish care    HPI Antonio Valenzuela presents to establish care Discussed the use of AI scribe software for clinical note transcription with the patient, who gave verbal consent to proceed.  History of Present Illness    Antonio Valenzuela is a 63 year old male who presents for a follow-up regarding his CPAP equipment needs and other health concerns.  Cpap therapy and equipment needs - Uses CPAP machine for seven to eight years for sleep-disordered breathing. - Requires prescription for new hoses and masks. - Uses ResMed 11 CPAP machine set to auto without heated humidification.  Respiratory symptoms - Persistent throat irritation and lingering cough following a severe congestion cold three months ago. - Cough worsens in cold weather. - Experiences throat tightness after receiving flu shot, managed with Benadryl. - Concerned about timing of flu shot and its impact on immune system. - No frequent heartburn or acid reflux, but certain foods can cause mild symptoms. - Smokes cigars daily.  Musculoskeletal pain - Wrist pain diagnosed as 'bone on bone' between thumb joints; received injection last year. - Wrist pain less severe this year. - Occasional right-sided back pain described as normal flare-up. - Uses over-the-counter supplements including vitamin D and turmeric for arthritis.  Cardiovascular health - Previous blood pressure reading of 153/85, which later decreased.  Weight changes - Regained weight lost for daughter's wedding six years ago.      Outpatient Encounter Medications as of 05/31/2024  Medication Sig   [DISCONTINUED] predniSONE  (DELTASONE ) 20 MG tablet Take 2 tablets (40 mg total) by mouth daily. (Patient taking differently: Take 40 mg by mouth as needed.)   No  facility-administered encounter medications on file as of 05/31/2024.    Past Medical History:  Diagnosis Date   Medical history non-contributory     Past Surgical History:  Procedure Laterality Date   COLONOSCOPY N/A 07/26/2013   Procedure: COLONOSCOPY;  Surgeon: Lamar CHRISTELLA [place]ingshead, MD;  [facility];  Service: Endoscopy;  Laterality: N/A;  8:30 AM   NO PAST SURGERIES      Family History  Problem Relation Age of Onset   Pulmonary disease Father    Colon cancer Neg Hx     Social History   Socioeconomic History   Marital status: Married    Spouse name: Not on file   Number of children: Not on file   Years of education: Not on file   Highest education level: Not on file  Occupational History   Not on file  Tobacco Use   Smoking status: Every Day    Current packs/day: 0.50    Average packs/day: 0.5 packs/day for 20.0 years (10.0 ttl pk-yrs)    Types: Cigarettes   Smokeless tobacco: Not on file  Substance and Sexual Activity   Alcohol use: Yes    Comment: very little per pt    Drug use: No   Sexual activity: Not on file  Other Topics Concern   Not on file  Social History Narrative   Not on file   Social Drivers of Health   Tobacco Use: High Risk (05/31/2024)   Patient History    Smoking Tobacco Use: Every Day    Smokeless Tobacco Use: Unknown  Passive Exposure: Not on file  Financial Resource Strain: Not on file  Food Insecurity: Not on file  Transportation Needs: Not on file  Physical Activity: Not on file  Stress: Not on file  Social Connections: Not on file  Intimate Partner Violence: Not on file  Depression (EYV7-0): Not on file  Alcohol Screen: Not on file  Housing: Not on file  Utilities: Not on file  Health Literacy: Not on file   ROS    Objective    BP 120/82 (BP Location: Left Arm, Patient Position: Sitting, Cuff Size: Normal)   Pulse 77   Ht 5' 10 (1.778 m)   Wt 231 lb 1.9 oz (104.8 kg)   SpO2 93%   BMI 33.16 kg/m    Physical Exam Vitals and nursing note reviewed.  Constitutional:      Appearance: Normal appearance. He is obese.  HENT:     Head: Normocephalic.     Right Ear: Tympanic membrane, ear canal and external ear normal.     Left Ear: Tympanic membrane, ear canal and external ear normal.     Nose: Nose normal.     Mouth/Throat:     Mouth: Mucous membranes are moist.     Pharynx: Oropharynx is clear.  Eyes:     Extraocular Movements: Extraocular movements intact.     Pupils: Pupils are equal, round, and reactive to light.  Cardiovascular:     Rate and Rhythm: Normal rate and regular rhythm.  Pulmonary:     Effort: Pulmonary effort is normal.     Breath sounds: Normal breath sounds.  Abdominal:     General: Abdomen is flat. Bowel sounds are normal.     Palpations: Abdomen is soft.  Musculoskeletal:        General: Normal range of motion.     Cervical back: Normal range of motion and neck supple.  Skin:    General: Skin is warm and dry.  Neurological:     Mental Status: He is alert and oriented to person, place, and time.  Psychiatric:        Mood and Affect: Mood normal.        Thought Content: Thought content normal.         Assessment & Plan:   Problem List Items Addressed This Visit       Respiratory   OSA on CPAP (Chronic)   Managed with CPAP therapy for 7-8 years. Requires new CPAP mask and hoses. No recent sleep study since initial diagnosis 8 years ago. CPAP machine is set to auto mode without humidification. - Prescribed new CPAP mask and hoses. - Discussed potential benefits of using humidification to alleviate throat irritation.      Relevant Orders   For home use only DME continuous positive airway pressure (CPAP)     Other   Nicotine dependence (Chronic)   Nicotine dependence (cigars) Chronic nicotine dependence with daily cigar smoking. Reports throat irritation, possibly related to smoking. - Advised smoking cessation or reduction to alleviate  throat irritation.         Obesity, Class I, BMI 30-34.9 - Primary   Acknowledges need for weight loss and increased exercise. Lost 40 pounds previously but regained weight over six years. - Encouraged weight loss and increased physical activity.      Elevated blood pressure reading without diagnosis of hypertension   Initial reading of 153/85 mmHg, decreased to 120/82 mmHg upon recheck. No current antihypertensive medication. Advised to monitor blood pressure at home. -  Advised home blood pressure monitoring.      Return in about 6 months (around 11/29/2024) for for yearly physical.   Leita Longs, FNP

## 2024-06-02 DIAGNOSIS — F17200 Nicotine dependence, unspecified, uncomplicated: Secondary | ICD-10-CM | POA: Insufficient documentation

## 2024-06-02 DIAGNOSIS — R030 Elevated blood-pressure reading, without diagnosis of hypertension: Secondary | ICD-10-CM | POA: Insufficient documentation

## 2024-06-02 DIAGNOSIS — G4733 Obstructive sleep apnea (adult) (pediatric): Secondary | ICD-10-CM | POA: Insufficient documentation

## 2024-06-02 DIAGNOSIS — E66811 Obesity, class 1: Secondary | ICD-10-CM | POA: Insufficient documentation

## 2024-06-02 NOTE — Assessment & Plan Note (Signed)
 Acknowledges need for weight loss and increased exercise. Lost 40 pounds previously but regained weight over six years. - Encouraged weight loss and increased physical activity.

## 2024-06-02 NOTE — Assessment & Plan Note (Signed)
 Nicotine dependence (cigars) Chronic nicotine dependence with daily cigar smoking. Reports throat irritation, possibly related to smoking. - Advised smoking cessation or reduction to alleviate throat irritation.

## 2024-06-02 NOTE — Assessment & Plan Note (Signed)
 Managed with CPAP therapy for 7-8 years. Requires new CPAP mask and hoses. No recent sleep study since initial diagnosis 8 years ago. CPAP machine is set to auto mode without humidification. - Prescribed new CPAP mask and hoses. - Discussed potential benefits of using humidification to alleviate throat irritation.

## 2024-06-02 NOTE — Assessment & Plan Note (Signed)
 Initial reading of 153/85 mmHg, decreased to 120/82 mmHg upon recheck. No current antihypertensive medication. Advised to monitor blood pressure at home. - Advised home blood pressure monitoring.
